# Patient Record
Sex: Male | Born: 1960 | Race: White | Hispanic: No | Marital: Married | Smoking: Current every day smoker
Health system: Southern US, Community
[De-identification: ages and names within clinical notes are randomized; demographics above are authoritative.]

## PROBLEM LIST (undated history)

## (undated) HISTORY — PX: THROAT SURGERY: SHX803

---

## 1997-12-11 ENCOUNTER — Other Ambulatory Visit: Admission: RE | Admit: 1997-12-11 | Discharge: 1997-12-11 | Payer: Self-pay | Admitting: Urology

## 2008-06-29 ENCOUNTER — Inpatient Hospital Stay (HOSPITAL_COMMUNITY): Admission: EM | Admit: 2008-06-29 | Discharge: 2008-07-01 | Payer: Self-pay | Admitting: Emergency Medicine

## 2008-08-02 ENCOUNTER — Ambulatory Visit (HOSPITAL_COMMUNITY): Admission: RE | Admit: 2008-08-02 | Discharge: 2008-08-03 | Payer: Self-pay | Admitting: Neurological Surgery

## 2008-08-29 ENCOUNTER — Encounter: Admission: RE | Admit: 2008-08-29 | Discharge: 2008-08-29 | Payer: Self-pay | Admitting: Neurological Surgery

## 2008-09-07 ENCOUNTER — Encounter: Admission: RE | Admit: 2008-09-07 | Discharge: 2008-09-07 | Payer: Self-pay | Admitting: Neurological Surgery

## 2008-10-30 ENCOUNTER — Encounter: Admission: RE | Admit: 2008-10-30 | Discharge: 2008-10-30 | Payer: Self-pay | Admitting: Neurological Surgery

## 2009-01-29 ENCOUNTER — Encounter: Admission: RE | Admit: 2009-01-29 | Discharge: 2009-01-29 | Payer: Self-pay | Admitting: Neurological Surgery

## 2009-04-30 ENCOUNTER — Encounter: Admission: RE | Admit: 2009-04-30 | Discharge: 2009-04-30 | Payer: Self-pay | Admitting: Neurological Surgery

## 2009-05-06 IMAGING — CR DG CERVICAL SPINE 2 OR 3 VIEWS
3 series · 3 of 3 positions shown · non-contrast
Comparison: [HOSPITAL] at [REDACTED] [HOSPITAL] cervical spine
radiograph 08/29/2008.

CLINICAL DATA: Left-sided neck pain.  Post ACDF.

CERVICAL SPINE - 2-3 VIEW

[w c-spine lat]
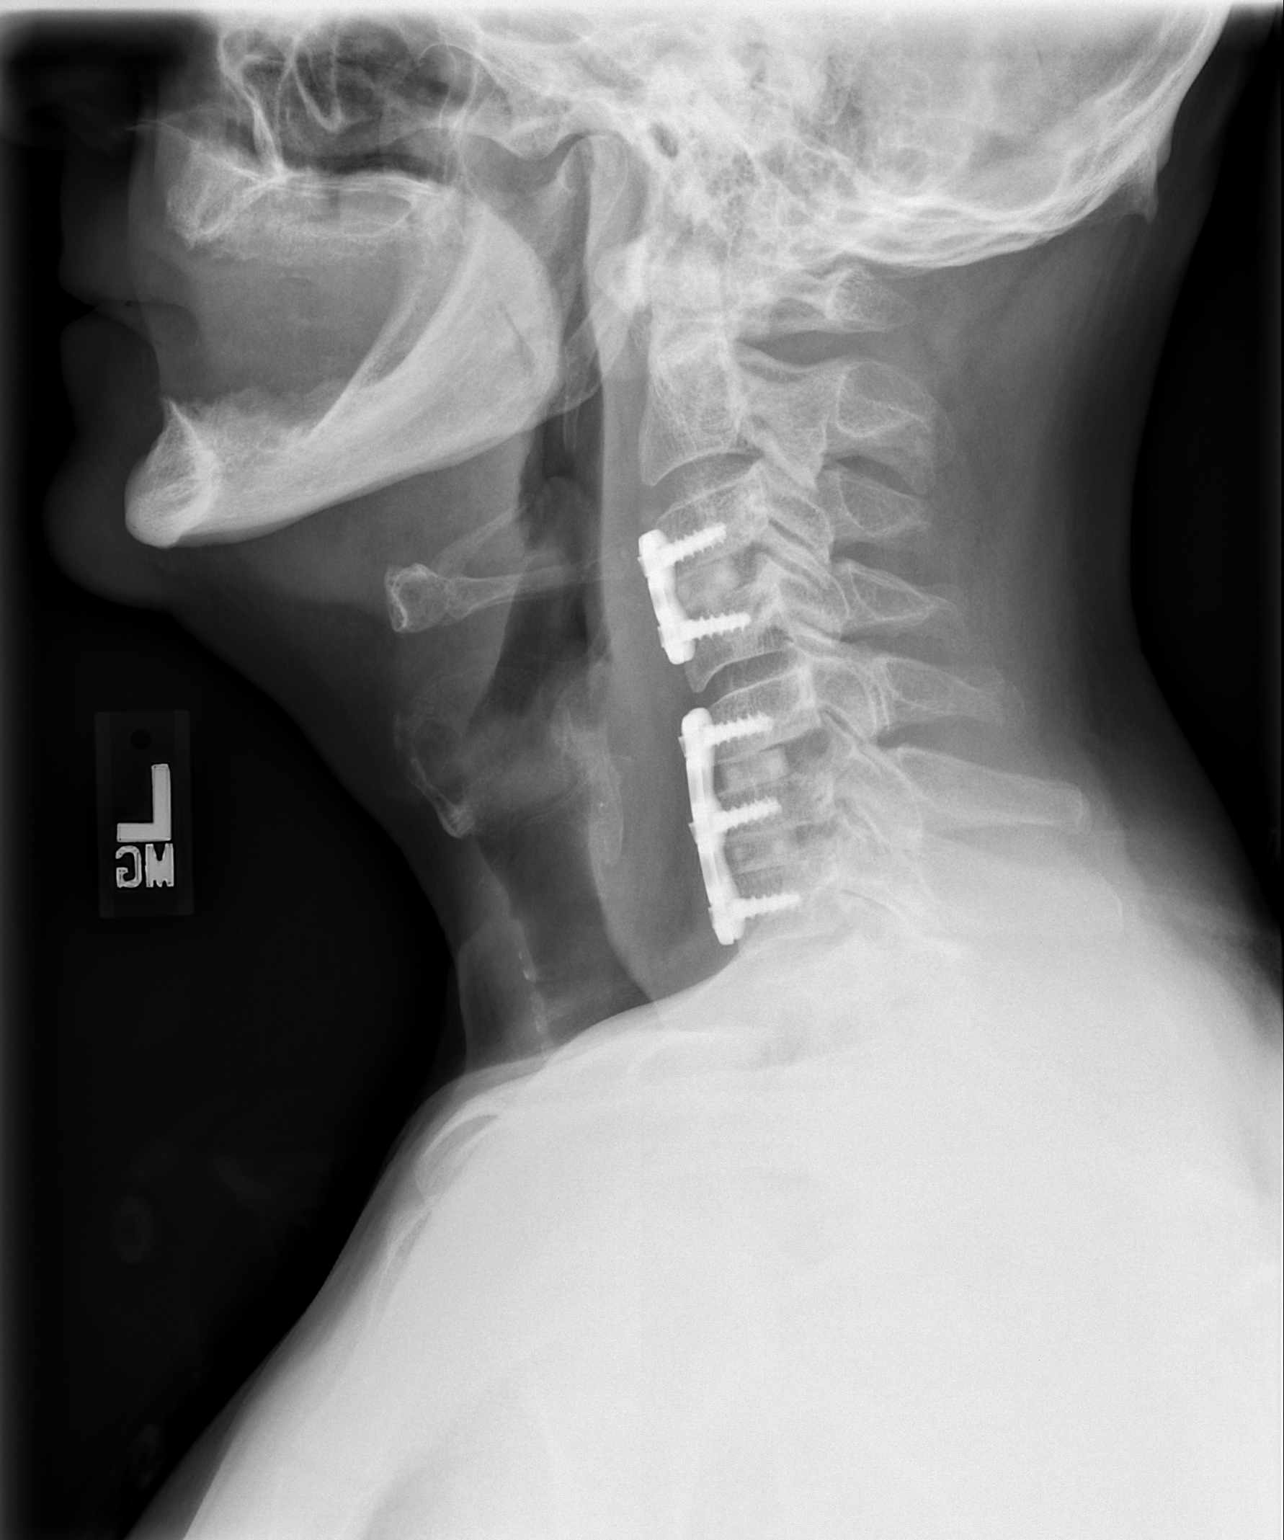

[w c-spine a.p. *]
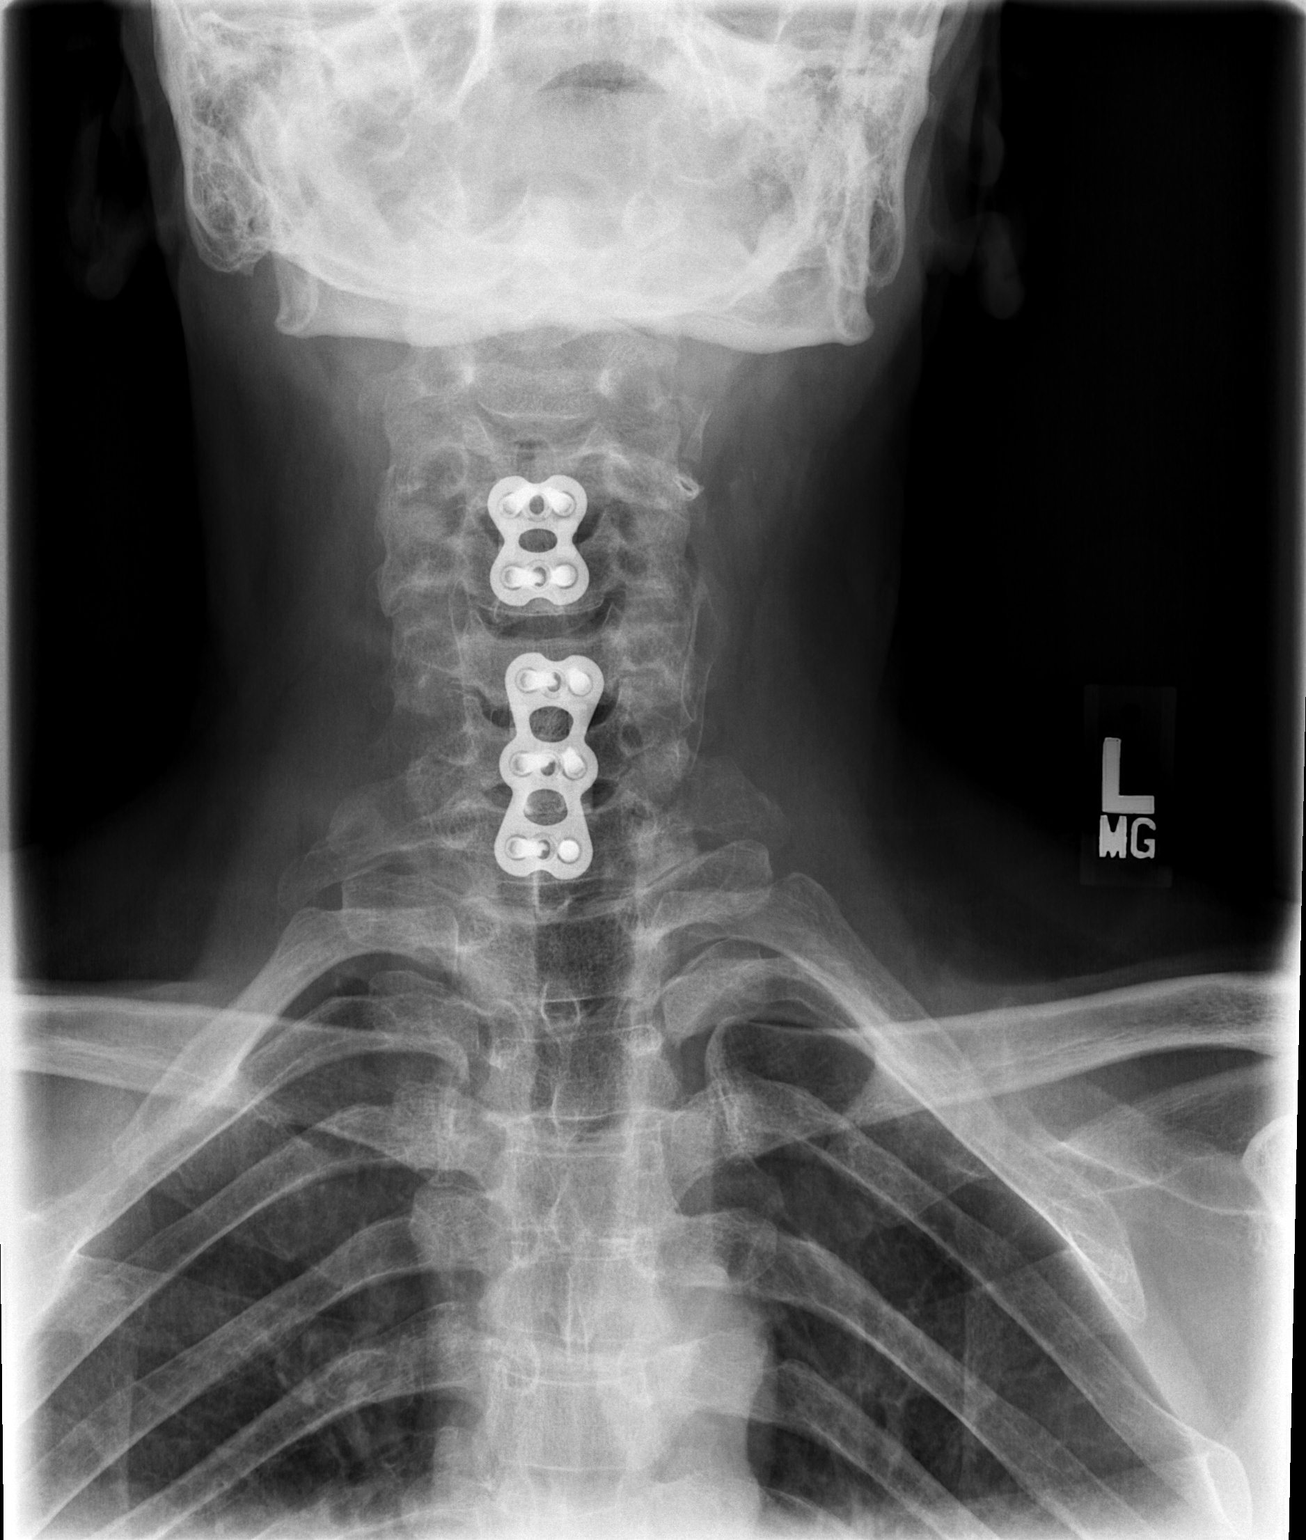

[w c-spine odontoid *]
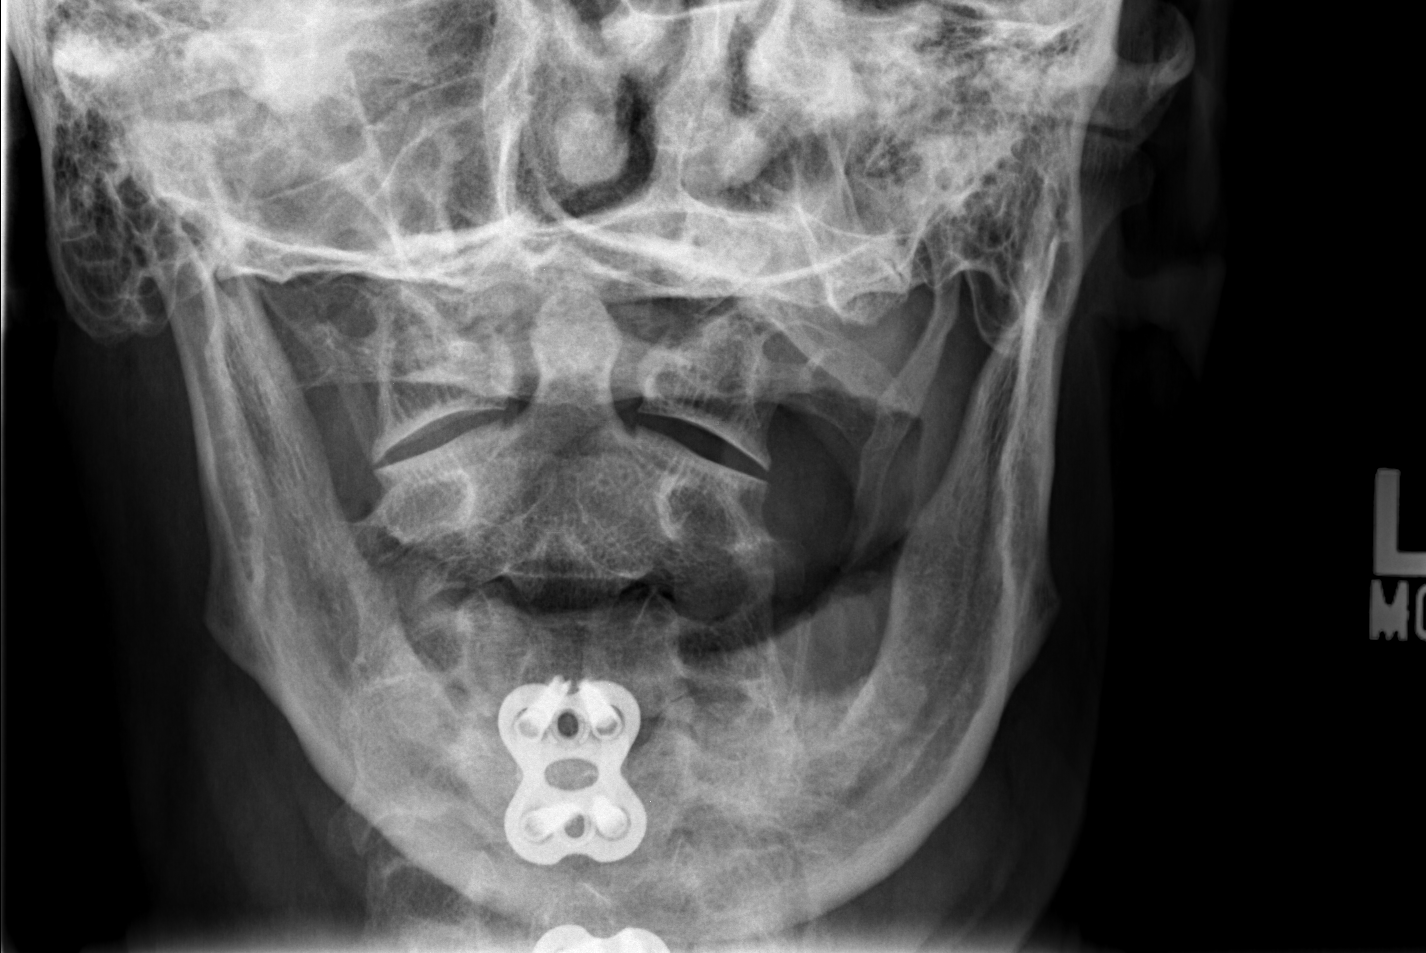

[3 of 3 positions shown; findings below may reference images not displayed]

FINDINGS: Anterior cervical discectomy C3-4 and C5-C7 appear stable
and satisfactory.  Remaining cervical disc spaces and vertebral
alignment normally maintained with no abnormal prevertebral soft
tissue swelling.  No new acute findings noted.
IMPRESSION: 1.  Satisfactory appearing anterior cervical discectomy fusion C3-4
and C5-C7.
2.  No acute findings.

## 2009-07-27 ENCOUNTER — Ambulatory Visit: Payer: Self-pay | Admitting: Family Medicine

## 2009-07-27 DIAGNOSIS — F17200 Nicotine dependence, unspecified, uncomplicated: Secondary | ICD-10-CM

## 2009-07-27 DIAGNOSIS — F528 Other sexual dysfunction not due to a substance or known physiological condition: Secondary | ICD-10-CM | POA: Insufficient documentation

## 2009-07-27 LAB — CONVERTED CEMR LAB
Blood in Urine, dipstick: NEGATIVE
Glucose, Urine, Semiquant: NEGATIVE
Specific Gravity, Urine: 1.02
pH: 6

## 2009-07-30 ENCOUNTER — Encounter: Admission: RE | Admit: 2009-07-30 | Discharge: 2009-07-30 | Payer: Self-pay | Admitting: Neurological Surgery

## 2009-07-30 LAB — CONVERTED CEMR LAB
ALT: 19 units/L (ref 0–53)
AST: 24 units/L (ref 0–37)
BUN: 16 mg/dL (ref 6–23)
Basophils Absolute: 0.1 10*3/uL (ref 0.0–0.1)
Bilirubin, Direct: 0.1 mg/dL (ref 0.0–0.3)
Cholesterol: 264 mg/dL — ABNORMAL HIGH (ref 0–200)
Creatinine, Ser: 0.9 mg/dL (ref 0.4–1.5)
Direct LDL: 183 mg/dL
Eosinophils Relative: 2.1 % (ref 0.0–5.0)
GFR calc non Af Amer: 95.39 mL/min (ref >60)
Glucose, Bld: 91 mg/dL (ref 70–99)
HDL: 50.6 mg/dL (ref >39.00)
Lymphs Abs: 1.8 10*3/uL (ref 0.7–4.0)
Monocytes Absolute: 0.7 10*3/uL (ref 0.1–1.0)
Monocytes Relative: 8 % (ref 3.0–12.0)
Neutrophils Relative %: 67.3 % (ref 43.0–77.0)
Platelets: 155 10*3/uL (ref 150.0–400.0)
RDW: 13.2 % (ref 11.5–14.6)
TSH: 0.98 microintl units/mL (ref 0.35–5.50)
Total Bilirubin: 0.8 mg/dL (ref 0.3–1.2)
Triglycerides: 171 mg/dL — ABNORMAL HIGH (ref 0.0–149.0)
VLDL: 34.2 mg/dL (ref 0.0–40.0)
WBC: 8.3 10*3/uL (ref 4.5–10.5)

## 2010-08-20 NOTE — Assessment & Plan Note (Signed)
Summary: NEW PT EST / POST HOSP F/U // RS   Vital Signs:  Patient profile:   50 year old male Height:      70 inches Weight:      165 pounds BMI:     23.76 Temp:     98.3 degrees F oral BP sitting:   130 / 90  (left arm)  Vitals Entered By: Kern Reap CMA Duncan Dull) (July 27, 2009 11:41 AM)  History of Present Illness: Gabriel Watson is a 50 year old, divorced male, smoker, two packs per day, and comes in today as a new patient to get established with a medical doctor.  On June 29, 2008 he fell off a roof fractured his neck which required surgery.  His neurosurgeon is Dr. Yetta Barre.  He is to see him for a final follow-up visit.  This coming Monday.  He continues to have pain in his neck.  He taking Vicodin from friends for pain relief.  Dr. Yetta Barre has him on tramadol and Flexeril.  Advised if Dr. Yetta Barre does not feel neurosurgical intervention is indicated.  The next step would be for him to go to the pain clinic.  He also smokes two packs of cigarettes a day for 23+ years.  At one time he took chantix and stop smoking for 6 months however, he states he had bad dreams.  Last medical examination over 20 years ago.  He also has erectile dysfunction, pain.  He's been buying Viagra off the Internet.  We explained to him the correlation between smoking and sexual dysfunction.  Preventive Screening-Counseling & Management  Alcohol-Tobacco     Smoking Status: current     Packs/Day: 2.0      Drug Use:  no.    Allergies (verified): No Known Drug Allergies  Past History:  Past medical, surgical, family and social histories (including risk factors) reviewed, and no changes noted (except as noted below).  Past Medical History: broken neck broken ankle, foot left  Past Surgical History: neck fussion  Family History: Reviewed history and no changes required. Father: healthy Mother: DM, HTN, heart disease Siblings: 1 brother 3 sisters -healthy Children: 3 - healthy  Social  History: Reviewed history and no changes required. Occupation:sevice tech Divorced Current Smoker Alcohol use-yes Drug use-no Smoking Status:  current Drug Use:  no Packs/Day:  2.0  Review of Systems      See HPI       Flu Vaccine Consent Questions     Do you have a history of severe allergic reactions to this vaccine? no    Any prior history of allergic reactions to egg and/or gelatin? no    Do you have a sensitivity to the preservative Thimersol? no    Do you have a past history of Guillan-Barre Syndrome? no    Do you currently have an acute febrile illness? no    Have you ever had a severe reaction to latex? no    Vaccine information given and explained to patient? yes    Are you currently pregnant? no    Lot Number:[id number]   Exp Date:01/17/2010   Site Given  Left Deltoid IM   Physical Exam  General:  Well-developed,well-nourished,in no acute distress; alert,appropriate and cooperative throughout examination Skin:  extremely ruddy complexion, secondary to chronic tobacco abuse   Problems:  Medical Problems Added: 1)  Dx of Erectile Dysfunction  (ICD-302.72) 2)  Dx of Tobacco Use  (ICD-305.1)  Impression & Recommendations:  Problem # 1:  ERECTILE DYSFUNCTION (ICD-302.72)  Assessment New  His updated medication list for this problem includes:    Viagra 100 Mg Tabs (Sildenafil citrate) ..... Uad  Orders: Venipuncture (44034) UA Dipstick w/o Micro (automated)  (81003) TLB-Lipid Panel (80061-LIPID) TLB-BMP (Basic Metabolic Panel-BMET) (80048-METABOL) TLB-CBC Platelet - w/Differential (85025-CBCD) TLB-Hepatic/Liver Function Pnl (80076-HEPATIC) TLB-TSH (Thyroid Stimulating Hormone) (74259-DGL) Prescription Created Electronically [phone])  Problem # 2:  TOBACCO USE (ICD-305.1) Assessment: New  His updated medication list for this problem includes:    Chantix Starting Month Pak 0.5 Mg X 11 & 1 Mg X 42 Tabs (Varenicline tartrate) .....  Uad  Orders: Venipuncture (33295) UA Dipstick w/o Micro (automated)  (81003) TLB-Lipid Panel (80061-LIPID) TLB-BMP (Basic Metabolic Panel-BMET) (80048-METABOL) TLB-CBC Platelet - w/Differential (85025-CBCD) TLB-Hepatic/Liver Function Pnl (80076-HEPATIC) TLB-TSH (Thyroid Stimulating Hormone) (18841-YSA) Prescription Created Electronically [phone])  Complete Medication List: 1)  Tramadol Hcl 50 Mg Tabs (Tramadol hcl) .... Take one tab every 4-6 hours as needed 2)  Nabumetone 750 Mg Tabs (Nabumetone) .... Take one tab two times a day as needed 3)  Cyclobenzaprine Hcl 10 Mg Tabs (Cyclobenzaprine hcl) .... Take one tab three times a day as needed 4)  Chantix Starting Month Pak 0.5 Mg X 11 & 1 Mg X 42 Tabs (Varenicline tartrate) .... Uad 5)  Viagra 100 Mg Tabs (Sildenafil citrate) .... Uad  Other Orders: Admin 1st Vaccine (01093) Flu Vaccine 42yrs + [phone])  Patient Instructions: 1)  begin the chantix, however when you get to the blue, 1 mg tablets, to not take one tablet twice a day, take a half a tablet once a day in the morning. 2)  Set up a CPX.  The first week in March. 3)  We will get your blood work today.  I will call you the report next week. 4)  Viagra 100 mg directions one half tablet one hour prior to sex with water. 5)  If there is nothing Dr. Yetta Barre can do for you surgically for the pain.  I would recommend that he refer you to the pain clinic Prescriptions: VIAGRA 100 MG TABS (SILDENAFIL CITRATE) UAD  #6 x 11   Entered and Authorized by:   Roderick Pee MD   Signed by:   Roderick Pee MD on 07/27/2009   Method used:   Print then Give to Patient   RxID:   [id number] CHANTIX STARTING MONTH PAK 0.5 MG X 11 & 1 MG X 42 TABS (VARENICLINE TARTRATE) UAD  #1 x 0   Entered and Authorized by:   Roderick Pee MD   Signed by:   Roderick Pee MD on 07/27/2009   Method used:   Print then Give to Patient   RxID:   [id number]   Laboratory Results   Urine  Tests    Routine Urinalysis   Color: brown Appearance: Hazy Glucose: negative   (Normal Range: Negative) Bilirubin: 1+   (Normal Range: Negative) Ketone: 1+   (Normal Range: Negative) Spec. Gravity: 1.020   (Normal Range: 1.003-1.035) Blood: negative   (Normal Range: Negative) pH: 6.0   (Normal Range: 5.0-8.0) Protein: 2+   (Normal Range: Negative) Urobilinogen: 4.0   (Normal Range: 0-1) Nitrite: negative   (Normal Range: Negative) Leukocyte Esterace: negative   (Normal Range: Negative)    Comments: Rita Ohara  July 27, 2009 12:36 PM

## 2010-11-04 LAB — DIFFERENTIAL
Eosinophils Absolute: 0.3 10*3/uL (ref 0.0–0.7)
Eosinophils Relative: 3 % (ref 0–5)
Lymphocytes Relative: 30 % (ref 12–46)
Lymphs Abs: 2.4 10*3/uL (ref 0.7–4.0)
Monocytes Absolute: 0.7 10*3/uL (ref 0.1–1.0)
Monocytes Relative: 8 % (ref 3–12)

## 2010-11-04 LAB — BASIC METABOLIC PANEL
BUN: 10 mg/dL (ref 6–23)
Chloride: 103 mEq/L (ref 96–112)
GFR calc non Af Amer: >60 mL/min (ref >60)
Potassium: 4.8 mEq/L (ref 3.5–5.1)
Sodium: 138 mEq/L (ref 135–145)

## 2010-11-04 LAB — CBC
HCT: 53.6 % — ABNORMAL HIGH (ref 39.0–52.0)
Hemoglobin: 17.9 g/dL — ABNORMAL HIGH (ref 13.0–17.0)
MCV: 97.7 fL (ref 78.0–100.0)
Platelets: 198 10*3/uL (ref 150–400)
RBC: 5.49 MIL/uL (ref 4.22–5.81)
WBC: 8.2 10*3/uL (ref 4.0–10.5)

## 2010-11-04 LAB — PROTIME-INR: Prothrombin Time: 13.1 seconds (ref 11.6–15.2)

## 2010-12-03 NOTE — Discharge Summary (Signed)
NAME:  Gabriel Watson, Gabriel Watson              ACCOUNT NO.:  [id number]   MEDICAL RECORD NO.:  [id number]          PATIENT TYPE:  INP   LOCATION:  3031                         FACILITY:  [facility]   PHYSICIAN:  Cherylynn Ridges, M.D.    DATE OF BIRTH:  1961-05-01   DATE OF ADMISSION:  06/29/2008  DATE OF DISCHARGE:  07/01/2008                               DISCHARGE SUMMARY   ADMITTING TRAUMA SURGEON:  Marta Lamas. Lindie Spruce, MD   CONSULTANTS:  1. Tia Alert, MD, Neurosurgery.  2. Burnard Bunting, MD, Orthopedic Surgery.   DISCHARGE DIAGNOSES:  1. Status post fall approximately 10-15 feet from a ladder.  2. Central spinal cord injury.  3. Left calcaneus fracture.   HISTORY:  This is a 50 year old male who was working, climbing a ladder  when he fell approximately 10-15 feet and landed directly on his back.  He may have had a very brief loss of consciousness and initially was  unable to move anything.  He has since been complaining of numbness and  tingling in his hands and weakness in his legs and arms, but no other  apparent complaints on admission.  Workup in the ED including a CT scan  of the head was without acute intracranial abnormality.  Cervical spine  CT scan was negative.  Chest, abdomen, and pelvic CT scans were all  negative.  It was felt that the patient likely had a central spinal cord  injury and he underwent MRI scanning and this showed diffuse cervical  spondylosis especially at C3-4 and C5-6 and C6-7 with significant  central canal stenosis at C3-4.  There was a small area of signal change  in the cervical cord at C3-4 on the left and at least moderate stenosis  at C5-6 and C6-7 with cord compression.  The patient was seen by Dr.  Yetta Barre.  As the patient was hospitalized, his symptoms improved greatly.  He had a mild residual central cord syndrome.  He was mobilized and did  well and was to follow up with Dr. Yetta Barre as he will likely need surgery,  but the plans are to do this on a more  elective basis and that the  surgery is to correct his spinal stenosis and prevent progressive signs  and symptoms of cervical spondylitic myelopathy and prevent any further  potential for acute cord injury related to trauma such as he had  suffered with his fall.  The patient, otherwise, had no untoward events.  He was complaining of some left heel pain and a x-ray was obtained.  He  did have left calcaneus fracture noted as well.  He was seen in  consultation by Dr. August Saucer and this was felt to be minimally displaced  without significant articular step-off.  This was treated nonoperatively  with splinting and the patient will follow up with Dr. August Saucer in 1 week  remaining nonweightbearing on his left lower extremity.   DISCHARGE DIET:  Regular.   DISCHARGE MEDICATIONS:  1. Lyrica 75 mg b.i.d.  2. Norco 5/325 one to two p.o. q.4 h. p.r.n. pain, #80, no refills.  The patient will call Trauma Service as needed, but again will follow up  with Dr. Yetta Barre and Dr. August Saucer as instructed.      Shawn Rayburn, P.A.      Cherylynn Ridges, M.D.  Electronically Signed    SR/MEDQ  D:  08/21/2008  T:  08/22/2008  Job:  [id number]

## 2010-12-03 NOTE — Consult Note (Signed)
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   BRIEF HISTORY OF PRESENT ILLNESS:  [name] is a 50 year old gentleman  who was at work yesterday climbing a ladder about 15 feet in the air to  a roof when the bolts from the ladder came loose, and he fell backwards  landing on his back.  He may have had a very brief loss of  consciousness.  He states he initially could not move anything.  He  complained of numbness and tingling in his hands with weakness in his  legs and arms but over the time in the hospital, has improved greatly to  the point he has no numbness, tingling, or sharp pain in his hands  anymore, and he feels strong in all 4 extremities.  He was admitted to  the Trauma Service.  MRI of the cervical spine showed cervical  spondylosis with cervical spinal stenosis at C3-4, C5-6, and C6-7 with a  small area of signal change in the left side of the cervical cord at C3-  4.  Neurosurgical evaluation was requested.   PAST MEDICAL HISTORY:  Vasectomy.   MEDICATIONS:  Denies.   ALLERGIES:  No known drug allergies.   SOCIAL HISTORY:  He is a heavy smoker.  He smokes at least 2 packs per  day and drinks social alcohol.   FAMILY HISTORY:  Noncontributory.   REVIEW OF SYSTEMS:  Noncontributory.   PHYSICAL EXAMINATION:  VITAL SIGNS:  He is afebrile, pulse is in the  80s, respirations 16-18, blood pressure 140s/80s, and oxygen saturations  good on room air.  HEENT:  Normocephalic and atraumatic.  Extraocular motions are intact.  NECK:  Supple and in a cervical collar and nontender.  HEART:  Regular  rhythm.  EXTREMITIES:  No obvious deformities.  NEUROLOGIC:  He is awake and alert.  He is interactive.  There is no  aphasia.  He has good attention span.  His fund of knowledge and memory  appear to be appropriate.  No facial asymmetry.  Tongue protrudes in  midline.  He appears to have fairly good strength throughout with good  muscle tone and good muscle bulk to end that exam as has negative  Hoffman sign.  Normal reflexes at this point with no clonus.  Sensation  is grossly intact.  Coordination and balance are not tested.   IMAGING STUDIES:  MRI of the cervical spine, I have reviewed as well as  its report, it shows diffuse cervical spondylosis especially at C3-4, C5-  6, and C6-7.  There is significant central canal stenosis at C3-4, where  there is a small area of signal change in the cervical cord at C3-4 on  the left.  There is also at least moderate stenosis at C5-6 and C6-7  with cord compression, worse at C5-6 but no signal change in the cord as  best I can tell.   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  He has a Foley catheter in place at this  time.  We will remove the Foley catheter and mobilize him and see how he  does with ambulation and make sure he empties his bladder.  At this  point, most of his central cord symptoms have resolved.  He seems to  have good strength with good muscle tone and bulk.  He may develop mild  myelopathy as time progresses.  I think it is safe to discharge him from  the hospital in a cervical collar assuming he can ambulate and void.  Then, I gave him a business card.  They are to schedule a followup  appointment with me next week or the following week to schedule surgery  at C3-4, C5-6, and C6-7 on a more elective basis.  I do not think there  is need for early surgical intervention in this case.  I have  explained  to him that the goal of the surgery is to correct the spinal stenosis  and prevent progressive signs and symptoms of cervical spondylitic  myelopathy as time progresses and also to prevent further cord injury in  an acute trauma situation such as he suffered yesterday.  I have gone  over the films with his family detailing the findings on the films and  how they relate to his symptoms.  I have recommended anterior cervical  diskectomy fusion and plating at C3-4, C5-6, and C6-7, and this will be  discussed further in an outpatient basis.      Tia Alert, MD  Electronically Signed     DSJ/MEDQ  D:  06/30/2008  T:  06/30/2008  Job:  [id number]

## 2010-12-03 NOTE — Op Note (Signed)
NAME:  Gabriel Watson, Gabriel Watson              ACCOUNT NO.:  [id number]   MEDICAL RECORD NO.:  [id number]          PATIENT TYPE:  INP   LOCATION:  3041                         FACILITY:  [facility]   PHYSICIAN:  Tia Alert, MD     DATE OF BIRTH:  [birth year]   DATE OF PROCEDURE:  08/02/2008  DATE OF DISCHARGE:                               OPERATIVE REPORT   PREOPERATIVE DIAGNOSIS:  Cervical spondylosis with cervical spinal  stenosis C3-4, C5-6, and C6-7 with history of central cord syndrome.   POSTOPERATIVE DIAGNOSIS:  Cervical spondylosis with cervical spinal  stenosis C3-4, C5-6, and C6-7 with history of central cord syndrome.   PROCEDURES:  1. Decompressive anterior cervical diskectomy at C3-4, C5-6, and C6-7.  2. Anterior cervical arthrodesis at C3-4, C5-6, and C6-7 with      corticocancellous allograft.  3. Anterior cervical plating C3-4 with a 23-mm [employer] plate.  4. Anterior cervical plating C5-C7 utilizing a 40-mm [employer]      plate.   SURGEON:  Tia Alert, MD   ASSISTANT:  Donalee Citrin, MD   ANESTHESIA:  General endotracheal.   COMPLICATIONS:  None apparent.   INDICATIONS FOR PROCEDURE:  Gabriel Watson is a 50 year old gentleman who  was involved in a fall several weeks ago in which he suffered a spinal  cord injury, felt to be mild central cord syndrome.  He had significant  improvement in his neurological status over 24 hours.  He was discharged  to home in a cervical collar with plans to follow up for a delayed  decompression and instrumented fusion.  I recommended a three-level ACDF  with plating at C3-4, C5-6, and C6-7 when MRI showed significant  stenosis at those levels.  He understood the risks, the benefits, and  expected outcome and wished to proceed.   DESCRIPTION OF THE PROCEDURE:  The patient was taken to the operating  room and after induction of adequate generalized endotracheal anesthesia  he was placed in supine position on the operating room  table.  His right  anterior cervical region was prepped with DuraPrep and draped in the  usual sterile fashion.  Local anesthesia 5 mL was then injected and an  incision was made over the C3-4 interspace and carried down to the  platysma which was elevated, opened, and undermined with Metzenbaum  scissors.  I then dissected a plane medial to the sternocleidomastoid  muscle and internal carotid artery lateral to the trachea and esophagus  to expose C3-4.  Intraoperative fluoroscopy confirmed my level.  Then I  took down the longus colli muscles and placed the shadow line retractors  under these to expose C3-4.  The annulus was incised and the initial  diskectomy was done with pituitary rongeurs and curved curettes.  I used  a high-speed drill to drill the endplates to a height of 8 mm drilling  in a rectangular fashion widening the disk space as I went down to the  posterior longitudinal ligament.  There were large overgrown osteophytes  at this level.  I was able to open the posterior longitudinal ligament  with  a nerve hook and utilizing microscopic dissection.  I used a 1 and  2-mm Kerrison punch to undercut the bodies of C3 and C4, angling the  scope up and down as I marched along to decompress the central canal  until the dura was full and capacious all the way across from foramen to  foramen.  I then palpated the nerve hook in a circumferential fashion to  assure adequate decompression with both visualization and palpation.  Once the decompression was complete, I measured the interspace to be 8  mm and used an 8-mm corticocancellous allograft and tapped this into  position at C3-4.  I then used a 23-mm plate and placed two 13-mm  variable angle screws in the bodies of C3-C4 and these locked into plate  by locking mechanism within the plate.  I then removed the retractor and  dried all bleeding points and checked this construct with fluoroscopy.  I then made a separate incision over  the C6-7 interspace, carried down  to the platysma which was elevated, opened, and undermined with  Metzenbaum scissors.  I then dissected a plane medial to the  sternocleidomastoid muscle and internal carotid artery and lateral to  the trachea and esophagus to expose C5-6 and C6-7.  The longus colli  muscles were taken down and the shadow line retractors were placed under  this to expose C5-6 and C6-7.  The annulus was incised and then  diskectomy was once again done with pituitary rongeurs and curved  curettes.  I then used the high-speed drill to drill the endplates again  to prepare for later arthrodesis and to widen the disk space.  I drilled  down to the level of the posterior longitudinal ligament where he had  large posterior osteophytes, especially at C5-6 coming off the C5  vertebral body, but also of the C6 vertebral body that was somewhat  larger on the right side.  We were able to open the posterior  longitudinal ligament with a nerve hook and then utilizing microscopic  dissection utilizing 1 and 2-mm Kerrison punch to march along the  endplates undercutting the C5 and C6 vertebral body from foramen to  foramen until the pedicles were palpable and then once the decompression  was complete the dura was full and we palpated once again in  circumferential fashion to assure adequate decompression.  We measured  the interspace of C5-6 to be 6 mm and used a 6-mm corticocancellous  allograft and tapped this into position at C5-6.  We performed the exact  same decompression at C6-7 utilizing the high-speed drill to drill the  endplates and then utilizing 1 and 2-mm Kerrison punch to open the  posterior longitudinal ligament and remove it by undercutting the bodies  of C6-C7 until the dura was full all the way across and we could palpate  into the foramina to assure adequate decompression of nerve roots and of  the central canal with both visualization and palpation.  We then   measured this interspace to be 7 mm and used a 7-mm corticocancellous  allograft and tapped this into position.  We then used a 40-mm Atlantis  Venture plate and placed two 13-mm variable angle screws at the bodies  of C5, C6, and C7 and these locked into plate by locking mechanism  within the plate.  We then spent considerable time drying the surgical  bed with Surgifoam and bipolar cautery and once meticulous hemostasis  was achieved in both wounds, we placed a  7 flat JP drain through a  separate stab incision and then closed the platysma with 3-0 Vicryl,  closed the subcuticular tissue with 3-0 Vicryl, and closed the skin with  benzoin and Steri-Strips.  The drapes were removed.  Sterile dressing  was applied.  The patient was awakened from general anesthesia and  transferred to the recovery room in stable condition.  At the end of the  procedure, all sponge, needle, and instrument counts were correct.      Tia Alert, MD  Electronically Signed     DSJ/MEDQ  D:  08/02/2008  T:  08/03/2008  Job:  [id number]

## 2010-12-03 NOTE — Consult Note (Signed)
NAME:  Gabriel Watson, Gabriel Watson              ACCOUNT NO.:  [id number]   MEDICAL RECORD NO.:  [id number]          PATIENT TYPE:  INP   LOCATION:  3031                         FACILITY:  [facility]   PHYSICIAN:  Burnard Bunting, M.D.    DATE OF BIRTH:  [birth year]   DATE OF CONSULTATION:  DATE OF DISCHARGE:  07/01/2008                                 CONSULTATION   Consult is requested by Lazaro Arms, PA on the Trauma Service.   CHIEF COMPLAINT:  Left heel pain.   HISTORY OF PRESENT ILLNESS:  Gabriel Watson is a 50 year old patient  admitted to the Trauma Service after falling 10-15 feet when he was  working.  The patient was admitted to the Trauma Service for central  cord syndrome.  He also describes left heel pain.  He denies any other  orthopedic complaints.  He said the heel is throbbing at times.  It is  hard for him to get comfortable.   PAST MEDICAL HISTORY:  Negative.   PAST SURGICAL HISTORY:  Notable for vasectomy.   SOCIAL HISTORY:  He does smoke 2 packs per day.  He does not use illegal  drugs.  Occasionally drinks.   ALLERGIES:  No known drug allergies.   MEDICATIONS:  He has no regular medications.   CURRENT MEDICATIONS:  1. Lovenox for DVT prophylaxis.  2. Dilaudid IV.  3. Lyrica for pain.  4. Nicotine patch.  5. Norco for pain.   REVIEW OF SYSTEMS:  All systems reviewed and are negative.   PHYSICAL EXAMINATION:  VITAL SIGNS:  Blood pressure 129/69, pulse is 80,  and respiration rate 18.  O2 sat is 95%.  GENERAL:  He is in no acute distress.  He is well-developed, well-  nourished, alert, and oriented.  EXTREMITIES:  He is able to move his upper and lower extremities.  He  has swelling around the left heel.  Pedal pulses are palpable.  Toe  motion is intact.  Compartment is still soft.  He has palpable nontender  anterior tib, posterior tib, peroneal, and Achilles tendon.  He does  have tenderness over the calcaneus.  The foot itself was not much varus.   LABORATORY  VALUES:  Sodium and potassium 136 and 3.8, BUN and creatinine  6 and 0.77.  Hemoglobin 16.9 and platelets 196.  CT scan of left foot  shows a calcaneus fracture with some extension to the posterior subtalar  facet with separation of fragments by a millimeter or 2, but no step-  off.  Boehler angle is maintained.   IMPRESSION:  Minimally displaced both comminuted left calcaneus fracture  without significant articular step-off.   PLAN:  I think this will do well with nonoperative therapy, especially  considering the fact that he is a 2 pack per day smoker.  The separation  of fragments without step-off should leave him with a congruent joint.  Risks and benefits of operative and nonoperative therapy discussed with  the patient.  He is anxious to avoid surgery if possible.  I think that  nonoperative care should give him a reasonable result in this fracture.  He will follow up with me in 1 week.  He will be nonweightbearing in the  left lower extremity.  We will change him over to a cath in 7 days.      Burnard Bunting, M.D.  Electronically Signed     GSD/MEDQ  D:  07/01/2008  T:  07/01/2008  Job:  [id number]

## 2011-04-25 LAB — CBC
HCT: 50.3 % (ref 39.0–52.0)
Hemoglobin: 16.9 g/dL (ref 13.0–17.0)
MCHC: 33.5 g/dL (ref 30.0–36.0)
MCV: 98.3 fL (ref 78.0–100.0)
RBC: 5.12 MIL/uL (ref 4.22–5.81)
WBC: 10.6 10*3/uL — ABNORMAL HIGH (ref 4.0–10.5)

## 2011-04-25 LAB — POCT I-STAT, CHEM 8
BUN: 12 mg/dL (ref 6–23)
Calcium, Ion: 1.09 mmol/L — ABNORMAL LOW (ref 1.12–1.32)
Chloride: 104 mEq/L (ref 96–112)
Glucose, Bld: 126 mg/dL — ABNORMAL HIGH (ref 70–99)
HCT: 52 % (ref 39.0–52.0)
Potassium: 4 mEq/L (ref 3.5–5.1)

## 2011-04-25 LAB — COMPREHENSIVE METABOLIC PANEL
AST: 24 U/L (ref 0–37)
Albumin: 3.7 g/dL (ref 3.5–5.2)
Alkaline Phosphatase: 73 U/L (ref 39–117)
BUN: 6 mg/dL (ref 6–23)
CO2: 26 mEq/L (ref 19–32)
Chloride: 104 mEq/L (ref 96–112)
GFR calc non Af Amer: >60 mL/min (ref >60)
Potassium: 3.8 mEq/L (ref 3.5–5.1)
Total Bilirubin: 0.8 mg/dL (ref 0.3–1.2)

## 2011-04-25 LAB — PROTIME-INR: Prothrombin Time: 13.4 seconds (ref 11.6–15.2)

## 2012-11-30 ENCOUNTER — Ambulatory Visit: Payer: BC Managed Care – PPO

## 2012-11-30 ENCOUNTER — Ambulatory Visit (INDEPENDENT_AMBULATORY_CARE_PROVIDER_SITE_OTHER): Payer: BC Managed Care – PPO | Admitting: Family Medicine

## 2012-11-30 VITALS — BP 146/81 | HR 85 | Temp 97.6°F | Resp 20 | Ht 69.0 in | Wt 151.0 lb

## 2012-11-30 DIAGNOSIS — M25549 Pain in joints of unspecified hand: Secondary | ICD-10-CM

## 2012-11-30 DIAGNOSIS — S63259A Unspecified dislocation of unspecified finger, initial encounter: Secondary | ICD-10-CM | POA: Diagnosis not present

## 2012-11-30 DIAGNOSIS — S63104A Unspecified dislocation of right thumb, initial encounter: Secondary | ICD-10-CM

## 2012-11-30 NOTE — Progress Notes (Signed)
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  Objective:  Right thumb obviously dislocated.  Digital block done with 4 cc marcaine.  [facility] reading (PRIMARY) by  Dr. Milus Glazier:  Right thumb: dislocated without fx.  After distracting the distal thumb, joint was relocated.  [facility] reading (PRIMARY) by  Dr. Milus Glazier:  Right thumb post manipulation film:  relocated   Assessment:  Dislocated thumb with torn ligaments.  Plan:  Thumb spica splint Recheck two weeks.  Signed, Elvina Sidle, MD

## 2013-10-25 ENCOUNTER — Encounter (HOSPITAL_COMMUNITY): Payer: Self-pay | Admitting: Emergency Medicine

## 2013-10-25 ENCOUNTER — Emergency Department (HOSPITAL_COMMUNITY)
Admission: EM | Admit: 2013-10-25 | Discharge: 2013-10-25 | Disposition: A | Discharge status: Home / Self Care | Payer: BC Managed Care – PPO | Attending: Emergency Medicine | Admitting: Emergency Medicine

## 2013-10-25 ENCOUNTER — Emergency Department (HOSPITAL_COMMUNITY): Payer: BC Managed Care – PPO

## 2013-10-25 DIAGNOSIS — N200 Calculus of kidney: Secondary | ICD-10-CM

## 2013-10-25 DIAGNOSIS — Z7982 Long term (current) use of aspirin: Secondary | ICD-10-CM | POA: Insufficient documentation

## 2013-10-25 DIAGNOSIS — R112 Nausea with vomiting, unspecified: Secondary | ICD-10-CM | POA: Insufficient documentation

## 2013-10-25 DIAGNOSIS — F17200 Nicotine dependence, unspecified, uncomplicated: Secondary | ICD-10-CM | POA: Insufficient documentation

## 2013-10-25 LAB — COMPREHENSIVE METABOLIC PANEL
ALT: 12 U/L (ref 0–53)
AST: 24 U/L (ref 0–37)
Albumin: 4 g/dL (ref 3.5–5.2)
Alkaline Phosphatase: 67 U/L (ref 39–117)
BUN: 15 mg/dL (ref 6–23)
CO2: 21 mEq/L (ref 19–32)
Calcium: 9.9 mg/dL (ref 8.4–10.5)
Chloride: 97 mEq/L (ref 96–112)
Creatinine, Ser: 0.98 mg/dL (ref 0.50–1.35)
GFR calc Af Amer: >90 mL/min (ref >90)
GFR calc non Af Amer: >90 mL/min (ref >90)
Glucose, Bld: 118 mg/dL — ABNORMAL HIGH (ref 70–99)
POTASSIUM: 4.2 meq/L (ref 3.7–5.3)
Sodium: 137 mEq/L (ref 137–147)
TOTAL PROTEIN: 7.1 g/dL (ref 6.0–8.3)
Total Bilirubin: 0.4 mg/dL (ref 0.3–1.2)

## 2013-10-25 LAB — CBC WITH DIFFERENTIAL/PLATELET
BASOS ABS: 0.1 10*3/uL (ref 0.0–0.1)
BASOS PCT: 0 % (ref 0–1)
Eosinophils Absolute: 0 10*3/uL (ref 0.0–0.7)
Eosinophils Relative: 0 % (ref 0–5)
HCT: 43.8 % (ref 39.0–52.0)
HEMOGLOBIN: 15.5 g/dL (ref 13.0–17.0)
Lymphocytes Relative: 5 % — ABNORMAL LOW (ref 12–46)
Lymphs Abs: 1 10*3/uL (ref 0.7–4.0)
MCH: 34.8 pg — AB (ref 26.0–34.0)
MCHC: 35.4 g/dL (ref 30.0–36.0)
MCV: 98.2 fL (ref 78.0–100.0)
MONOS PCT: 7 % (ref 3–12)
Monocytes Absolute: 1.4 10*3/uL — ABNORMAL HIGH (ref 0.1–1.0)
NEUTROS PCT: 87 % — AB (ref 43–77)
Neutro Abs: 16.8 10*3/uL — ABNORMAL HIGH (ref 1.7–7.7)
Platelets: 135 10*3/uL — ABNORMAL LOW (ref 150–400)
RBC: 4.46 MIL/uL (ref 4.22–5.81)
RDW: 15.1 % (ref 11.5–15.5)
WBC: 19.3 10*3/uL — ABNORMAL HIGH (ref 4.0–10.5)

## 2013-10-25 LAB — URINALYSIS, ROUTINE W REFLEX MICROSCOPIC
Bilirubin Urine: NEGATIVE
Glucose, UA: 100 mg/dL — AB
Hgb urine dipstick: NEGATIVE
Ketones, ur: 15 mg/dL — AB
Leukocytes, UA: NEGATIVE
NITRITE: NEGATIVE
PROTEIN: NEGATIVE mg/dL
Specific Gravity, Urine: 1.021 (ref 1.005–1.030)
UROBILINOGEN UA: 0.2 mg/dL (ref 0.0–1.0)
pH: 6 (ref 5.0–8.0)

## 2013-10-25 LAB — LIPASE, BLOOD: LIPASE: 36 U/L (ref 11–59)

## 2013-10-25 LAB — I-STAT TROPONIN, ED: TROPONIN I, POC: 0 ng/mL (ref 0.00–0.08)

## 2013-10-25 MED ORDER — ONDANSETRON HCL 4 MG/2ML IJ SOLN
4.0000 mg | Freq: Once | INTRAMUSCULAR | Status: AC
  Administered 2013-10-25: 4 mg via INTRAVENOUS
  Filled 2013-10-25: qty 2

## 2013-10-25 MED ORDER — SODIUM CHLORIDE 0.9 % IV SOLN
1000.0000 mL | INTRAVENOUS | Status: DC
  Administered 2013-10-25: 1000 mL via INTRAVENOUS

## 2013-10-25 MED ORDER — KETOROLAC TROMETHAMINE 30 MG/ML IJ SOLN
30.0000 mg | Freq: Once | INTRAMUSCULAR | Status: AC
  Administered 2013-10-25: 30 mg via INTRAVENOUS
  Filled 2013-10-25: qty 1

## 2013-10-25 MED ORDER — ONDANSETRON HCL 4 MG PO TABS: 4.0000 mg | ORAL_TABLET | Freq: Four times a day (QID) | ORAL | Status: AC

## 2013-10-25 MED ORDER — SODIUM CHLORIDE 0.9 % IV SOLN
1000.0000 mL | Freq: Once | INTRAVENOUS | Status: AC
Start: 2013-10-25 — End: 2013-10-25
  Administered 2013-10-25: 1000 mL via INTRAVENOUS

## 2013-10-25 MED ORDER — TAMSULOSIN HCL 0.4 MG PO CAPS: 0.4000 mg | ORAL_CAPSULE | Freq: Every day | ORAL | Status: AC

## 2013-10-25 MED ORDER — OXYCODONE-ACETAMINOPHEN 5-325 MG PO TABS: 1.0000 | ORAL_TABLET | Freq: Four times a day (QID) | ORAL | Status: AC | PRN

## 2013-10-25 MED ORDER — HYDROMORPHONE HCL PF 1 MG/ML IJ SOLN
1.0000 mg | INTRAMUSCULAR | Status: DC | PRN
  Administered 2013-10-25 (×2): 1 mg via INTRAVENOUS
  Filled 2013-10-25 (×2): qty 1

## 2013-10-25 NOTE — ED Notes (Signed)
Bed: [id number] Expected date:  Expected time:  Means of arrival:  Comments: ems abd pain

## 2013-10-25 NOTE — ED Provider Notes (Signed)
CSN: [id number]     Arrival date & time 10/25/13  1191 History   First MD Initiated Contact with Patient 10/25/13 [id number]     Chief Complaint  Patient presents with  . Flank Pain    Patient is a 53 y.o. male presenting with abdominal pain. The history is provided by the patient.  Abdominal Pain Pain location:  L flank Pain quality: sharp   Pain radiation: lower abdomen. Pain severity:  Severe Duration:  1 day Timing:  Constant Progression:  Waxing and waning Chronicity:  New Relieved by:  Nothing Ineffective treatments:  None tried Associated symptoms: nausea and vomiting   Associated symptoms: no diarrhea, no fever and no hematuria   Pt felt fine last night watching the basketball ball game. The pain last evening after that.  The pain comes in waves.  EMS gave him zofran and fentanyl but his pain has returned.  No history of kidney stones or prior abdominal surgeries/pain.  History reviewed. No pertinent past medical history. Past Surgical History  Procedure Laterality Date  . Throat surgery     History reviewed. No pertinent family history. History  Substance Use Topics  . Smoking status: Current Every Day Smoker  . Smokeless tobacco: Current User  . Alcohol Use: Yes    Review of Systems  Constitutional: Negative for fever.  Gastrointestinal: Positive for nausea, vomiting and abdominal pain. Negative for diarrhea.  Genitourinary: Negative for hematuria.  All other systems reviewed and are negative.      Allergies  Review of patient's allergies indicates no known allergies.  Home Medications   Current Outpatient Rx  Name  Route  Sig  Dispense  Refill  . aspirin EC 81 MG tablet   Oral   Take 81 mg by mouth daily as needed for mild pain.         Marland Kitchen ondansetron (ZOFRAN) 4 MG tablet   Oral   Take 1 tablet (4 mg total) by mouth every 6 (six) hours.   12 tablet   0   . oxyCODONE-acetaminophen (PERCOCET/ROXICET) 5-325 MG per tablet   Oral   Take 1-2 tablets by  mouth every 6 (six) hours as needed.   16 tablet   0   . tamsulosin (FLOMAX) 0.4 MG CAPS capsule   Oral   Take 1 capsule (0.4 mg total) by mouth daily after supper.   7 capsule   0    BP 129/88  Pulse 77  Temp(Src) 98 F (36.7 C) (Oral)  Resp 14  SpO2 94% Physical Exam  Nursing note and vitals reviewed. Constitutional: He appears well-developed and well-nourished. He appears distressed.  HENT:  Head: Normocephalic and atraumatic.  Right Ear: External ear normal.  Left Ear: External ear normal.  Eyes: Conjunctivae are normal. Right eye exhibits no discharge. Left eye exhibits no discharge. No scleral icterus.  Neck: Neck supple. No tracheal deviation present.  Cardiovascular: Normal rate, regular rhythm and intact distal pulses.   Pulmonary/Chest: Effort normal and breath sounds normal. No stridor. No respiratory distress. He has no wheezes. He has no rales.  Abdominal: Soft. Bowel sounds are normal. He exhibits no distension. There is tenderness in the epigastric area and left upper quadrant. There is no rigidity, no rebound, no guarding and no CVA tenderness. No hernia.  Musculoskeletal: He exhibits no edema and no tenderness.  Neurological: He is alert. He has normal strength. No cranial nerve deficit (no facial droop, extraocular movements intact, no slurred speech) or sensory deficit. He exhibits  normal muscle tone. He displays no seizure activity. Coordination normal.  Skin: Skin is warm and dry. No rash noted. He is not diaphoretic.  Psychiatric: He has a normal mood and affect.    ED Course  Procedures (including critical care time) Labs Review Labs Reviewed  CBC WITH DIFFERENTIAL - Abnormal; Notable for the following:    WBC 19.3 (*)    MCH 34.8 (*)    Platelets 135 (*)    Neutrophils Relative % 87 (*)    Neutro Abs 16.8 (*)    Lymphocytes Relative 5 (*)    Monocytes Absolute 1.4 (*)    All other components within normal limits  COMPREHENSIVE METABOLIC PANEL -  Abnormal; Notable for the following:    Glucose, Bld 118 (*)    All other components within normal limits  URINALYSIS, ROUTINE W REFLEX MICROSCOPIC - Abnormal; Notable for the following:    Glucose, UA 100 (*)    Ketones, ur 15 (*)    All other components within normal limits  LIPASE, BLOOD  I-STAT TROPOININ, ED   Imaging Review Ct Abdomen Pelvis Wo Contrast  10/25/2013   CLINICAL DATA:  Left flank pain and nausea.  Question stone.  EXAM: CT ABDOMEN AND PELVIS WITHOUT CONTRAST  TECHNIQUE: Multidetector CT imaging of the abdomen and pelvis was performed following the standard protocol without intravenous contrast.  COMPARISON:  CT abdomen and pelvis 06/29/2008  FINDINGS: The lung bases are clear without focal nodule, mass, or airspace disease. The heart size is normal. No significant pleural or pericardial effusion is present.  The liver and spleen are within normal limits. The stomach, duodenum, and pancreas are unremarkable. The common bile duct and gallbladder are normal. Adrenal glands are normal bilaterally. The right kidney and ureter are within normal limits.  Mild to moderate left-sided hydronephrosis is present. The left kidney is obstructed at the level of the UPJ by 4.5 mm stone. The more distal ureter is within normal limits. Moderate stranding surrounds the left kidney.  Atherosclerotic calcifications are present within the abdominal aorta and and branch vessels without aneurysm. The rectosigmoid colon is within normal limits. The remainder the colon is within normal limits. The appendix is visualized and normal. Small bowel is unremarkable. There is no significant adenopathy or free fluid. The urinary bladder is within normal limits. The prostate is normal for age.  Bone windows demonstrate a large superior endplate Schmorl's node at T11, new since 2009. This could be posttraumatic.  IMPRESSION: 1. Obstructing 4.5 mm stone at the left UPJ with mild to moderate left-sided hydronephrosis. No  additional stones are evident. 2. Atherosclerosis without evidence for aneurysm. 3. Large superior endplate Schmorl's node/fracture is new since 2009. This could be posttraumatic. It does not appear acute.   Electronically Signed   By: Gennette Pachris  Mattern M.D.   On: 10/25/2013 08:15   Medications  0.9 %  sodium chloride infusion (1,000 mLs Intravenous New Bag/Given 10/25/13 0747)    Followed by  0.9 %  sodium chloride infusion (1,000 mLs Intravenous New Bag/Given 10/25/13 0747)  HYDROmorphone (DILAUDID) injection 1 mg (1 mg Intravenous Given 10/25/13 0954)  ondansetron (ZOFRAN) injection 4 mg (4 mg Intravenous Given 10/25/13 0748)  ketorolac (TORADOL) 30 MG/ML injection 30 mg (30 mg Intravenous Given 10/25/13 0955)     MDM   Final diagnoses:  Kidney stone    Symptoms are due to a left sided ureteral stone at the UPJ.   Pt's symptoms improved with ed treatment.  Discussed CT  scan findings (he fell off a rood a few years ago, aware of the spine issue.  Will dc home with pain meds, flomax.  Refer to urology.      Celene Kras, MD 10/25/13 [phone]

## 2013-10-25 NOTE — ED Notes (Signed)
Brought in by EMS from home with c/o left flank pain.  Per EMS, pt reports that he started having abdominal pain that radiates down to his left flank last night; pain is getting progressively worse that pt now has nausea, no vomiting.  Pt was given Zofran 4 mg IV and Fentanyl 200 mcg IV en route to ED.  Pt reports he has no hx of kidney stones.

## 2013-10-25 NOTE — Discharge Instructions (Signed)
Kidney Stones  Kidney stones (urolithiasis) are deposits that form inside your kidneys. The intense pain is caused by the stone moving through the urinary tract. When the stone moves, the ureter goes into spasm around the stone. The stone is usually passed in the urine.   CAUSES   · A disorder that makes certain neck glands produce too much parathyroid hormone (primary hyperparathyroidism).  · A buildup of uric acid crystals, similar to gout in your joints.  · Narrowing (stricture) of the ureter.  · A kidney obstruction present at birth (congenital obstruction).  · Previous surgery on the kidney or ureters.  · Numerous kidney infections.  SYMPTOMS   · Feeling sick to your stomach (nauseous).  · Throwing up (vomiting).  · Blood in the urine (hematuria).  · Pain that usually spreads (radiates) to the groin.  · Frequency or urgency of urination.  DIAGNOSIS   · Taking a history and physical exam.  · Blood or urine tests.  · CT scan.  · Occasionally, an examination of the inside of the urinary bladder (cystoscopy) is performed.  TREATMENT   · Observation.  · Increasing your fluid intake.  · Extracorporeal shock wave lithotripsy This is a noninvasive procedure that uses shock waves to break up kidney stones.  · Surgery may be needed if you have severe pain or persistent obstruction. There are various surgical procedures. Most of the procedures are performed with the use of small instruments. Only small incisions are needed to accommodate these instruments, so recovery time is minimized.  The size, location, and chemical composition are all important variables that will determine the proper choice of action for you. Talk to your health care provider to better understand your situation so that you will minimize the risk of injury to yourself and your kidney.   HOME CARE INSTRUCTIONS   · Drink enough water and fluids to keep your urine clear or pale yellow. This will help you to pass the stone or stone fragments.  · Strain  all urine through the provided strainer. Keep all particulate matter and stones for your health care provider to see. The stone causing the pain may be as small as a grain of salt. It is very important to use the strainer each and every time you pass your urine. The collection of your stone will allow your health care provider to analyze it and verify that a stone has actually passed. The stone analysis will often identify what you can do to reduce the incidence of recurrences.  · Only take over-the-counter or prescription medicines for pain, discomfort, or fever as directed by your health care provider.  · Make a follow-up appointment with your health care provider as directed.  · Get follow-up X-rays if required. The absence of pain does not always mean that the stone has passed. It may have only stopped moving. If the urine remains completely obstructed, it can cause loss of kidney function or even complete destruction of the kidney. It is your responsibility to make sure X-rays and follow-ups are completed. Ultrasounds of the kidney can show blockages and the status of the kidney. Ultrasounds are not associated with any radiation and can be performed easily in a matter of minutes.  SEEK MEDICAL CARE IF:  · You experience pain that is progressive and unresponsive to any pain medicine you have been prescribed.  SEEK IMMEDIATE MEDICAL CARE IF:   · Pain cannot be controlled with the prescribed medicine.  · You have a fever   or shaking chills.  · The severity or intensity of pain increases over 18 hours and is not relieved by pain medicine.  · You develop a new onset of abdominal pain.  · You feel faint or pass out.  · You are unable to urinate.  MAKE SURE YOU:   · Understand these instructions.  · Will watch your condition.  · Will get help right away if you are not doing well or get worse.  Document Released: 07/07/2005 Document Revised: 03/09/2013 Document Reviewed: 12/08/2012  [employer]® Patient Information ©2014  [employer].

## 2019-10-20 DEATH — deceased
